# Patient Record
Sex: Female | Born: 1970 | Hispanic: Yes | Smoking: Never smoker
Health system: Southern US, Community
[De-identification: ages and names within clinical notes are randomized; demographics above are authoritative.]

## PROBLEM LIST (undated history)

## (undated) DIAGNOSIS — F329 Major depressive disorder, single episode, unspecified: Secondary | ICD-10-CM

## (undated) DIAGNOSIS — F32A Depression, unspecified: Secondary | ICD-10-CM

## (undated) DIAGNOSIS — I10 Essential (primary) hypertension: Secondary | ICD-10-CM

## (undated) DIAGNOSIS — E785 Hyperlipidemia, unspecified: Secondary | ICD-10-CM

## (undated) HISTORY — DX: Hyperlipidemia, unspecified: E78.5

---

## 2005-04-07 ENCOUNTER — Emergency Department: Payer: Self-pay | Admitting: General Practice

## 2005-05-10 ENCOUNTER — Emergency Department: Payer: Self-pay | Admitting: Emergency Medicine

## 2005-05-30 ENCOUNTER — Emergency Department: Payer: Self-pay | Admitting: Emergency Medicine

## 2005-05-31 ENCOUNTER — Ambulatory Visit: Payer: Self-pay | Admitting: Emergency Medicine

## 2007-03-13 ENCOUNTER — Inpatient Hospital Stay: Payer: Self-pay

## 2012-07-15 ENCOUNTER — Emergency Department: Payer: Self-pay | Admitting: Emergency Medicine

## 2012-07-15 LAB — DRUG SCREEN, URINE
Barbiturates, Ur Screen: NEGATIVE (ref ?–200)
Cannabinoid 50 Ng, Ur ~~LOC~~: NEGATIVE (ref ?–50)
MDMA (Ecstasy)Ur Screen: POSITIVE (ref ?–500)
Methadone, Ur Screen: NEGATIVE (ref ?–300)
Tricyclic, Ur Screen: NEGATIVE (ref ?–1000)

## 2012-07-15 LAB — COMPREHENSIVE METABOLIC PANEL
Alkaline Phosphatase: 60 U/L (ref 50–136)
Anion Gap: 8 (ref 7–16)
Bilirubin,Total: 0.3 mg/dL (ref 0.2–1.0)
Calcium, Total: 8.7 mg/dL (ref 8.5–10.1)
Chloride: 106 mmol/L (ref 98–107)
Creatinine: 0.56 mg/dL — ABNORMAL LOW (ref 0.60–1.30)
EGFR (African American): >60
EGFR (Non-African Amer.): >60
Osmolality: 276 (ref 275–301)
Potassium: 3.6 mmol/L (ref 3.5–5.1)
SGOT(AST): 20 U/L (ref 15–37)
Sodium: 138 mmol/L (ref 136–145)
Total Protein: 7.7 g/dL (ref 6.4–8.2)

## 2012-07-15 LAB — URINALYSIS, COMPLETE
Bilirubin,UR: NEGATIVE
Blood: NEGATIVE
Ketone: NEGATIVE
Nitrite: NEGATIVE
Ph: 5 (ref 4.5–8.0)
RBC,UR: 2 /HPF (ref 0–5)
Specific Gravity: 1.028 (ref 1.003–1.030)
Squamous Epithelial: 8
WBC UR: 22 /HPF (ref 0–5)

## 2012-07-15 LAB — TSH: Thyroid Stimulating Horm: 1.42 u[IU]/mL

## 2012-07-15 LAB — CBC
MCH: 29.3 pg (ref 26.0–34.0)
MCV: 86 fL (ref 80–100)
Platelet: 284 10*3/uL (ref 150–440)
WBC: 8.2 10*3/uL (ref 3.6–11.0)

## 2012-07-15 LAB — ETHANOL: Ethanol: <3 mg/dL

## 2014-12-13 ENCOUNTER — Emergency Department: Payer: Self-pay

## 2014-12-13 ENCOUNTER — Emergency Department
Admission: EM | Admit: 2014-12-13 | Discharge: 2014-12-13 | Disposition: A | Discharge status: Home / Self Care | Payer: Self-pay | Attending: Emergency Medicine | Admitting: Emergency Medicine

## 2014-12-13 ENCOUNTER — Encounter: Payer: Self-pay | Admitting: Emergency Medicine

## 2014-12-13 DIAGNOSIS — Z3202 Encounter for pregnancy test, result negative: Secondary | ICD-10-CM | POA: Insufficient documentation

## 2014-12-13 DIAGNOSIS — R1084 Generalized abdominal pain: Secondary | ICD-10-CM | POA: Insufficient documentation

## 2014-12-13 DIAGNOSIS — B349 Viral infection, unspecified: Secondary | ICD-10-CM | POA: Insufficient documentation

## 2014-12-13 LAB — COMPREHENSIVE METABOLIC PANEL
ALK PHOS: 52 U/L (ref 38–126)
ALT: 15 U/L (ref 14–54)
AST: 18 U/L (ref 15–41)
Albumin: 4.4 g/dL (ref 3.5–5.0)
Anion gap: 7 (ref 5–15)
BUN: 16 mg/dL (ref 6–20)
CALCIUM: 9.5 mg/dL (ref 8.9–10.3)
CO2: 27 mmol/L (ref 22–32)
CREATININE: 0.72 mg/dL (ref 0.44–1.00)
Chloride: 103 mmol/L (ref 101–111)
Glucose, Bld: 105 mg/dL — ABNORMAL HIGH (ref 65–99)
Potassium: 3.5 mmol/L (ref 3.5–5.1)
Sodium: 137 mmol/L (ref 135–145)
Total Bilirubin: 0.5 mg/dL (ref 0.3–1.2)
Total Protein: 7.8 g/dL (ref 6.5–8.1)

## 2014-12-13 LAB — CBC
HCT: 36.5 % (ref 35.0–47.0)
Hemoglobin: 11.9 g/dL — ABNORMAL LOW (ref 12.0–16.0)
MCH: 27 pg (ref 26.0–34.0)
MCHC: 32.5 g/dL (ref 32.0–36.0)
MCV: 82.9 fL (ref 80.0–100.0)
PLATELETS: 274 10*3/uL (ref 150–440)
RBC: 4.4 MIL/uL (ref 3.80–5.20)
RDW: 14.4 % (ref 11.5–14.5)
WBC: 9.5 10*3/uL (ref 3.6–11.0)

## 2014-12-13 LAB — URINALYSIS COMPLETE WITH MICROSCOPIC (ARMC ONLY)
BILIRUBIN URINE: NEGATIVE
Bacteria, UA: NONE SEEN
Glucose, UA: NEGATIVE mg/dL
Hgb urine dipstick: NEGATIVE
KETONES UR: NEGATIVE mg/dL
Nitrite: NEGATIVE
PROTEIN: NEGATIVE mg/dL
SPECIFIC GRAVITY, URINE: 1.02 (ref 1.005–1.030)
pH: 6 (ref 5.0–8.0)

## 2014-12-13 LAB — LIPASE, BLOOD: Lipase: 27 U/L (ref 22–51)

## 2014-12-13 MED ORDER — IBUPROFEN 600 MG PO TABS
600.0000 mg | ORAL_TABLET | Freq: Once | ORAL | Status: AC
  Administered 2014-12-13: 600 mg via ORAL
  Filled 2014-12-13: qty 1

## 2014-12-13 MED ORDER — SODIUM CHLORIDE 0.9 % IV BOLUS (SEPSIS): 1000.0000 mL | Freq: Once | INTRAVENOUS | Status: DC

## 2014-12-13 MED ORDER — KETOROLAC TROMETHAMINE 30 MG/ML IJ SOLN
30.0000 mg | Freq: Once | INTRAMUSCULAR | Status: DC
  Filled 2014-12-13: qty 1

## 2014-12-13 NOTE — ED Notes (Signed)
Patient to ED with report of generalized aches and pain specifically her head, her abdomen, her hips, her neck. Patient is concerned that she may have a high blood sugar although has never been diagnosed with Diabetes. Patient also reports she took a home pregnancy test which was positive but that doctor checked yesterday and it was negative.

## 2014-12-13 NOTE — ED Notes (Signed)
Pt states she is unable to provide urine sample at this time.

## 2014-12-13 NOTE — ED Notes (Signed)
Jacqui, interpreter, at bedside.

## 2014-12-13 NOTE — ED Notes (Signed)
Maritza, interpreter, and Dr. Huel Cote at bedside.

## 2014-12-13 NOTE — ED Provider Notes (Signed)
Time Seen: Approximately 1340  I have reviewed the triage notes  Chief Complaint: Headache; Generalized Body Aches; and Breast Pain   History of Present Illness: Lori Pham is a 44 y.o. female states she's had some generalized body aches and discomforts now for the last 2 weeks. Patient presents with low-grade fever. She denies any tick bite or exposure tickborne disease. She states she has a mild headache over the back of her head. She went to a clinic yesterday and was told that she might have diabetes?. Patient states that she also had some inconsistencies on some pregnancy test. She had a positive pregnancy test at home and a negative one at the office yesterday. She's had some nausea but no persistent vomiting. She's had some loose stool without any true signs of diarrhea. She denies any photophobia.   History reviewed. No pertinent past medical history.  There are no active problems to display for this patient.   History reviewed. No pertinent past surgical history.  History reviewed. No pertinent past surgical history.  No current outpatient prescriptions on file.  Allergies:  Review of patient's allergies indicates not on file.  Family History: History reviewed. No pertinent family history.  Social History: Social History  Substance Use Topics  . Smoking status: Never Smoker   . Smokeless tobacco: None  . Alcohol Use: No     Review of Systems:   10 point review of systems was performed and was otherwise negative:  Constitutional: The patient was aware of her low-grade fever Eyes: No visual disturbances ENT: No sore throat, ear pain Cardiac: No chest pain Respiratory: No shortness of breath, wheezing, or stridor Abdomen: Mild diffuse lower abdominal cramping, no vomiting, No diarrhea Endocrine: No weight loss, No night sweats Extremities: Patient describes diffuse joint discomfort Skin: No rashes, easy bruising Neurologic: No focal  weakness, trouble with speech or swollowing Urologic: No dysuria, Hematuria, or urinary frequency   Physical Exam:  ED Triage Vitals  Enc Vitals Group     BP 12/13/14 1250 122/77 mmHg     Pulse Rate 12/13/14 1250 72     Resp 12/13/14 1250 20     Temp 12/13/14 1250 99 F (37.2 C)     Temp Source 12/13/14 1250 Oral     SpO2 12/13/14 1250 98 %     Weight 12/13/14 1250 186 lb (84.369 kg)     Height 12/13/14 1250 5' 4.96" (1.65 m)     Head Cir --      Peak Flow --      Pain Score 12/13/14 1250 5     Pain Loc --      Pain Edu? --      Excl. in GC? --     General: Awake , Alert , and Oriented times 3; GCS 15 Head: Normal cephalic , atraumatic Eyes: Pupils equal , round, reactive to light Nose/Throat: No nasal drainage, patent upper airway without erythema or exudate.  Neck: Supple, Full range of motion, No anterior adenopathy or palpable thyroid masses Lungs: Clear to ascultation without wheezes , rhonchi, or rales Heart: Regular rate, regular rhythm without murmurs , gallops , or rubs Abdomen: Soft, non tender without rebound, guarding , or rigidity; bowel sounds positive and symmetric in all 4 quadrants. No organomegaly .        Extremities: 2 plus symmetric pulses. No edema, clubbing or cyanosis Neurologic: normal ambulation, Motor symmetric without deficits, sensory intact Skin: warm, dry, no rashes   Labs:  All laboratory work was reviewed including any pertinent negatives or positives listed below:  Labs Reviewed  COMPREHENSIVE METABOLIC PANEL - Abnormal; Notable for the following:    Glucose, Bld 105 (*)    All other components within normal limits  CBC - Abnormal; Notable for the following:    Hemoglobin 11.9 (*)    All other components within normal limits  LIPASE, BLOOD  URINALYSIS COMPLETEWITH MICROSCOPIC ([facility] ONLY)  POC URINE PREG, ED   laboratory work was reviewed and showed no significant abnormalities    Radiology:    CHEST 2 VIEW  COMPARISON:  None.  FINDINGS: The heart size and mediastinal contours are within normal limits. Both lungs are clear. The visualized skeletal structures are unremarkable.  IMPRESSION: No active cardiopulmonary disease.    I personally reviewed the radiologic studies   Procedures: None Critical Care: None    ED Course: Patient explanation of her results and her testing done through interpreter. Her diagnosis is tentatively a viral syndrome given a broad-spectrum of her symptoms along with the low-grade fever. She does not appear to have a bacterial source for infection such as pneumonia or urinary tract infection. I do not suspect this is some form of meningitis. Patient was advised take over-the-counter ibuprofen and drink plenty of fluids and return here for condition worsens.    Assessment:  Viral syndrome   Final Clinical Impression: Viral syndrome  Final diagnoses:  None     Plan:Patient was advised to return immediately if condition worsens. Patient was advised to follow up with her primary care physician or other specialized physicians involved and in their current assessment.          Go to top  Jennye Moccasin, MD 12/13/14 [id number]

## 2014-12-15 LAB — POCT PREGNANCY, URINE: PREG TEST UR: NEGATIVE

## 2015-10-04 ENCOUNTER — Ambulatory Visit
Admission: EM | Admit: 2015-10-04 | Discharge: 2015-10-04 | Disposition: A | Discharge status: Home / Self Care | Payer: Worker's Compensation | Attending: Family Medicine | Admitting: Family Medicine

## 2015-10-04 ENCOUNTER — Encounter: Payer: Self-pay | Admitting: Emergency Medicine

## 2015-10-04 DIAGNOSIS — S060X0A Concussion without loss of consciousness, initial encounter: Secondary | ICD-10-CM | POA: Diagnosis not present

## 2015-10-04 DIAGNOSIS — S161XXA Strain of muscle, fascia and tendon at neck level, initial encounter: Secondary | ICD-10-CM

## 2015-10-04 HISTORY — DX: Essential (primary) hypertension: I10

## 2015-10-04 MED ORDER — CYCLOBENZAPRINE HCL 10 MG PO TABS: 10.0000 mg | ORAL_TABLET | Freq: Every day | ORAL | Status: AC

## 2015-10-04 NOTE — ED Notes (Addendum)
On Monday a box weighing 5 lbs. Fell from above and hit her on the back of her head while she was at work.  Patient reports HAS and nausea.

## 2015-10-04 NOTE — Discharge Instructions (Signed)
Conmocin cerebral - Adultos (Concussion, Adult) Una conmocin cerebral, o traumatismo cerebral cerrado, es una lesin cerebral causada por un golpe directo en la cabeza o por un movimiento rpido y brusco (sacudida) de la cabeza o el cuello. Generalmente no pone en peligro la vida. An as, los efectos de [place] conmocin cerebral pueden ser graves. Si sufri una conmocin cerebral antes, es ms probable que experimente sntomas similares a una conmocin cerebral despus de un golpe directo en la cabeza.  CAUSAS   Un golpe directo en la cabeza, como al chocar contra otro jugador en un partido de ftbol, recibir un golpe en una lucha o golpearse la cabeza contra una superficie dura.  Una sacudida de la cabeza o el cuello que hace que el cerebro se mueva hacia adelante y [place] atrs dentro del crneo, como en un choque automovilstico. SIGNOS Y SNTOMAS  Los signos de una conmocin cerebral pueden ser difciles de Chief Strategy Officer. En un primer momento, los pacientes, familiares y profesionales tal vez no los adviertan. Puede ser que aparentemente est normal pero que acte o se sienta diferente. Por lo general, los sntomas son transitorios pero pueden durar [employer], semanas o an ms. Algunos sntomas pueden aparecer inmediatamente mientras otros pueden manifestarse despus de algunas horas o [address]  [address]. Cada lesin en la cabeza es diferente. Los sntomas son:   Dolor de cabeza leve a moderado, que no se [place].  Sensacin de presin dentro de la cabeza.  Presentar ms dificultad que lo habitual para:  Aprender o recordar cosas que ha escuchado.  Responder preguntas.  Prestar atencin o concentrarse.  Organizar las tareas diarias.  Tomar decisiones y [employer].  Lentitud para pensar, actuar o reaccionar, hablar o leer.  Sentirse perdido o confundirse con facilidad.  Sentirse cansado todo el tiempo o con falta de Engineer, drilling (fatigado).  Sentirse somnoliento.  Trastornos del  sueo.  Dormir ms que lo habitual.  Dormir menos que lo habitual.  Problemas para conciliar el sueo.  Problemas para dormir (insomnio).  Prdida del equilibrio, sensacin de mareo.  Nuseas o vmitos.  Adormecimiento u hormigueo.  Mayor sensibilidad para:  Los sonidos.  Las luces.  Distracciones.  Problemas visuales o cansancio en los ojos.  Disminucin del sentido del gusto o Cabin crew.  Zumbidos en los odos.  Cambios de humor, como sentirse triste o ansioso.  Irritacin o enojo por cosas pequeas o sin motivos.  Falta de motivacin.  Ver o escuchar cosas que otras personas no ven ni escuchan (alucinaciones). DIAGNSTICO  El mdico diagnosticar una conmocin cerebral basndose en la descripcin de la lesin y los sntomas. Le preguntar si se desmay (perdi el conocimiento) y si tiene dificultad para recordar los sucesos ocurridos inmediatamente antes y durante el traumatismo.  La evaluacin tambin puede incluir:   Un escaneo cerebral para encontrar signos de lesin cerebral. Aunque los estudios no muestren lesiones, igual puede haber sufrido una conmocin cerebral.  Anlisis de sangre para asegurarse de que no hay otros problemas. TRATAMIENTO   La mayor parte de las conmociones se tratan en el servicio de emergencias, el rea de atencin de urgencia o el consultorio mdico. Es posible que deba Engineer, maintenance hospital durante la noche para Advertising account planner.  Informe a su mdico si toma medicamentos, incluidos los medicamentos recetados, medicamentos de venta libre y remedios naturales. Algunos medicamentos, como los anticoagulantes y la aspirina, pueden aumentar la probabilidad de sufrir complicaciones. Tambin informe a su mdico si ha bebido alcohol o consume drogas. Esta  informacin puede Licensed conveyancer.  El mdico le dar el alta con instrucciones importantes que deber seguir.  La velocidad de recuperacin de una conmocin cerebral  depende de varios factores. Entre ellos se incluyen la gravedad de la conmocin cerebral, la zona del cerebro lesionada, la edad y [place] de salud previo a la conmocin cerebral.  La mayora de las personas que han sufrido una lesin leve se recuperan completamente. La recuperacin puede llevar tiempo. En general, la recuperacin es ms lenta en las [employer]. Adems, a aquellas personas que ya han sufrido una conmocin cerebral en el pasado les llevar ms tiempo recuperarse de la lesin actual. INSTRUCCIONES PARA EL CUIDADO EN EL HOGAR  Instrucciones generales  Siga cuidadosamente las indicaciones que su mdico le ha dado.  Utilice los medicamentos de venta libre o recetados para Primary school teacher, Environmental health practitioner o la fiebre, segn se lo indique el mdico.  Tome slo los medicamentos que su mdico le haya autorizado.  No beba alcohol hasta que su mdico lo autorice. El alcohol y algunas otras sustancias pueden demorar su recuperacin y ponerlo en riesgo de nuevas lesiones.  Si le resulta ms difcil que lo habitual recordar las cosas, escrbalas.  Trate de hacer una cosa por vez si se distrae con facilidad. Por ejemplo, no mire televisin mientras prepara la cena.  Consulte con familiares y amigos si debe tomar decisiones importantes.  Cumpla con todas las visitas de control. Se recomienda realizar varias evaluaciones de los sntomas para favorecer su recuperacin.  Controle sus sntomas y pdale a otras personas que tambin lo hagan. En algunos casos pueden aparecer complicaciones luego de una conmocin cerebral. Los adultos mayores con lesin cerebral pueden tener un mayor riesgo de complicaciones graves, como un cogulo sanguneo en el cerebro.  Informe a los [employer], el departamento de enfermera de la escuela, el consejero escolar, Public affairs consultant acerca de su lesin, los sntomas y las restricciones que tiene. Hgales saber lo que puede y lo que no Scientist, product/process development. Ellos  debern observar:  Aumento en los problemas de atencin o Librarian, academic.  Ms dificultades para recordar o aprender informacin nueva.  Aumento del tiempo que necesita para completar tareas o encargos.  Aumento de la irritabilidad o disminucin de la capacidad para Social worker.  Aparicin de nuevos sntomas.  Haga reposo. El descanso favorece la curacin del cerebro. Asegrese de que:  Duerme bien por la noche. Evite quedarse despierto muy tarde por la noche.  Se va a dormir a la [employer] de [address] y los fines de [place].  Descanse Administrator. Toma siestas durante el da o momentos de descanso cuando se siente cansado.  Limita las actividades que requieren mucha atencin o Librarian, academic. Estas pueden ser:  Tareas para el hogar o trabajos relacionados con el empleo.  Mirar televisin.  Trabajar en la computadora.  Evite toda situacin en la que se pudiera producir otra lesin cerebral (ftbol americano, hockey, ftbol, bsquet, artes marciales, deportes de nieve cuesta abajo y andar a caballo). El problema empeorar cada vez que experimente una conmocin cerebral. Debe evitar estas actividades hasta que sea evaluado por el mdico correspondiente. Regreso a las Mining engineer a sus actividades habituales gradualmente, no de [place]. Debe darles al cuerpo y el cerebro su tiempo para recuperarse.  No retome la prctica de deportes ni otras actividades deportivas hasta que su mdico le diga que puede [place].  Consulte a su mdico sobre  cundo puede conducir automviles, andar en bicicleta u operar mquinas pesadas. La capacidad para reaccionar puede ser ms lenta luego de una lesin cerebral. Nunca realice estas actividades si se siente mareado.  Pregunte a su mdico cuando podr volver a la escuela o al [place]en Campi. Prevencin de otra conmocin cerebral Es muy importante que evite otra lesin cerebral, especialmente antes de que se haya  recuperado. En casos raros, una nueva lesin puede causar daos cerebrales permanentes, hinchazn del cerebro o la muerte. El riesgo es mayor durante los primeros 7 a 10 das despus de una lesin en la cabeza. Evite las lesiones:   Use el cinturn de seguridad al conducir un automvil.  Beba alcohol solo con moderacin.  Use un casco cuando ande en bicicleta, esque, patine o realice actividades similares.  Evite actividades que podran causar una segunda conmocin cerebral, como deportes de contacto o recreativos hasta que su mdico lo autorice.  Implemente medidas de seguridad en el hogar.  Evite el desorden y objetos que puedan ser peligrosos en pisos y escaleras.  Coloque barras en los baos y pasamanos en las escaleras.  Ponga alfombras antideslizantes en pisos y baeras.  Mejore la iluminacin en zonas de penumbra. SOLICITE ATENCIN MDICA SI:   Aumentan sus problemas de atencin o concentracin.  Aumentan sus dificultades para recordar o aprender informacin nueva.  Necesita ms tiempo que antes para completar las tareas o asignaciones.  Aumenta la irritabilidad o disminuye la capacidad para Social worker.  Tiene ms sntomas que antes. Solicite atencin mdica si presenta alguno de los siguientes sntomas durante ms de 2 semanas despus de su lesin:   Dolor de cabeza que perdura (crnico).  Mareos o problemas con el equilibrio.  Nuseas.  Problemas de visin.  Aumento de la sensibilidad a los ruidos o Statistician.  Depresin y cambios en el estado de nimo.  Ansiedad o irritabilidad.  Problemas de memoria.  Dificultad para concentrarse o Engineer, technical sales.  Problemas para dormir.  Cansancio permanente. SOLICITE ATENCIN MDICA DE INMEDIATO SI:   Los dolores de [place] son intensos o empeoran. Esto puede ser un signo de que hay un cogulo sanguneo en la cabeza.  Siente debilidad (aun si es solo en [place], una pierna o parte del  rostro).  Siente entumecimiento.  Le falta coordinacin.  Vomita repetidas veces.  Est ms somnoliento.  Una pupila est ms grande que la otra.  Tiene convulsiones.  Tiene dificultad para hablar.  Siente una confusin que va en aumento. Esto puede ser un signo de que hay un cogulo sanguneo en el cerebro.  Aumenta la intranquilidad, la agitacin o la irritabilidad.  No puede Nutritional therapist o lugares.  Siente dolor en el cuello.  Le resulta difcil despertarse.  Tiene cambios de conducta inusuales.  Pierde la conciencia. ASEGRESE DE QUE:   Comprende estas instrucciones.  Controlar su afeccin.  Recibir ayuda de inmediato si no mejora o si empeora.   Esta informacin no tiene Theme park manager el consejo del mdico. Asegrese de hacerle al mdico cualquier pregunta que tenga.   Document Released: 03/14/2008 Document Revised: 04/22/2014 Elsevier Interactive Patient Education [employer] Post-Concussion Syndrome Post-concussion syndrome describes the symptoms that can occur after a head injury. These symptoms can last from weeks to months. CAUSES  It is not clear why some head injuries cause post-concussion syndrome. It can occur whether your head injury was mild or severe and whether you were wearing head protection or not.  SIGNS AND SYMPTOMS  Memory difficulties.  Dizziness.  Headaches.  Double vision or blurry vision.  Sensitivity to light.  Hearing difficulties.  Depression.  Tiredness.  Weakness.  Difficulty with concentration.  Difficulty sleeping or staying asleep.  Vomiting.  Poor balance or instability on your feet.  Slow reaction time.  Difficulty learning and remembering things you have heard. DIAGNOSIS  There is no test to determine whether you have post-concussion syndrome. Your health care provider may order an imaging scan of your brain, such as a CT scan, to check for other problems that may be causing your  symptoms (such as a severe injury inside your skull). TREATMENT  Usually, these problems disappear over time without medical care. Your health care provider may prescribe medicine to help ease your symptoms. It is important to follow up with a neurologist to evaluate your recovery and address any lingering symptoms or issues. HOME CARE INSTRUCTIONS   Take medicines only as directed by your health care provider. Do not take aspirin. Aspirin can slow blood clotting.  Sleep with your head slightly elevated to help with headaches.  Avoid any situation where there is potential for another head injury. This includes football, hockey, soccer, basketball, martial arts, downhill snow sports, and horseback riding. Your condition will get worse every time you experience a concussion. You should avoid these activities until you are evaluated by the appropriate follow-up health care providers.  Keep all follow-up visits as directed by your health care provider. This is important. SEEK MEDICAL CARE IF:  You have increased problems paying attention or concentrating.  You have increased difficulty remembering or learning new information.  You need more time to complete tasks or assignments than before.  You have increased irritability or decreased ability to cope with stress.  You have more symptoms than before. Seek medical care if you have any of the following symptoms for more than two weeks after your injury:  Lasting (chronic) headaches.  Dizziness or balance problems.  Nausea.  Vision problems.  Increased sensitivity to noise or light.  Depression or mood swings.  Anxiety or irritability.  Memory problems.  Difficulty concentrating or paying attention.  Sleep problems.  Feeling tired all the time. SEEK IMMEDIATE MEDICAL CARE IF:  You have confusion or unusual drowsiness.  Others find it difficult to wake you up.  You have nausea or persistent, forceful vomiting.  You feel  like you are moving when you are not (vertigo). Your eyes may move rapidly back and forth.  You have convulsions or faint.  You have severe, persistent headaches that are not relieved by medicine.  You cannot use your arms or legs normally.  One of your pupils is larger than the other.  You have clear or bloody discharge from your nose or ears.  Your problems are getting worse, not better. MAKE SURE YOU:  Understand these instructions.  Will watch your condition.  Will get help right away if you are not doing well or get worse.   This information is not intended to replace advice given to you by your health care provider. Make sure you discuss any questions you have with your health care provider.   Document Released: 09/21/2001 Document Revised: 04/22/2014 Document Reviewed: 07/07/2013 Elsevier Interactive Patient Education [employer]2016 Elsevier Inc.

## 2015-10-04 NOTE — ED Provider Notes (Signed)
CSN: [id number]     Arrival date & time 10/04/15  1025 History   First MD Initiated Contact with Patient 10/04/15 1044     Chief Complaint  Patient presents with  . Head Injury  . Worker's Comp    (Consider location/radiation/quality/duration/timing/severity/associated sxs/prior Treatment) HPI Comments: 45 yo female with a c/o intermittent headaches and upper back pain since injury 2 days ago at work. Patient states she was bent over when a box of paper towels (total weight about 5 pounds, per patient) fell on the back of her head. Patient denies any loss of consciousness, vomiting, vision changes, numbness/tingling. However states felt a little "whoozy" or lightheaded right after the injury. Since then has had headaches, but denies "worst headache" ever.   The history is provided by the patient.    Past Medical History  Diagnosis Date  . Hypertension    History reviewed. No pertinent past surgical history. History reviewed. No pertinent family history. Social History  Substance Use Topics  . Smoking status: Never Smoker   . Smokeless tobacco: Never Used  . Alcohol Use: No   OB History    No data available     Review of Systems  Allergies  Penicillins  Home Medications   Prior to Admission medications   Medication Sig Start Date End Date Taking? Authorizing Provider  cyclobenzaprine (FLEXERIL) 10 MG tablet Take 1 tablet (10 mg total) by mouth at bedtime. 10/04/15   Payton Mccallum, MD   Meds Ordered and Administered this Visit  Medications - No data to display  BP 107/74 mmHg  Pulse 56  Temp(Src) 97.6 F (36.4 C) (Oral)  Resp 16  Ht  (1.651 m)  Wt 180 lb (81.647 kg)  BMI 29.95 kg/m2  SpO2 100%  LMP 09/10/2015 (Approximate) No data found.   Physical Exam  Constitutional: She is oriented to person, place, and time. She appears well-developed and well-nourished. No distress.  HENT:  Head: Normocephalic and atraumatic.  Right Ear: Tympanic membrane, external  ear and ear canal normal.  Left Ear: Tympanic membrane, external ear and ear canal normal.  Nose: Nose normal.  Mouth/Throat: Uvula is midline, oropharynx is clear and moist and mucous membranes are normal.  Eyes: EOM are normal. Pupils are equal, round, and reactive to light.  Neck: Normal range of motion. Neck supple. No JVD present. No tracheal deviation present. No thyromegaly present.  Cardiovascular: Normal rate, regular rhythm, normal heart sounds and intact distal pulses.   No murmur heard. Pulmonary/Chest: Effort normal and breath sounds normal. No stridor. No respiratory distress. She has no wheezes. She has no rales. She exhibits no tenderness.  Musculoskeletal: She exhibits tenderness. She exhibits no edema.       Cervical back: She exhibits tenderness (over the cervical paraspinous muscles and trapezius muscles; no bony tenderness) and spasm. She exhibits normal range of motion, no bony tenderness, no swelling, no edema, no deformity, no laceration and normal pulse.       Back:  Lymphadenopathy:    She has no cervical adenopathy.  Neurological: She is alert and oriented to person, place, and time. She has normal reflexes. She displays no tremor and normal reflexes. No cranial nerve deficit or sensory deficit. She exhibits normal muscle tone. Coordination and gait normal.  Skin: Skin is warm. No rash noted. She is not diaphoretic. No erythema.  Psychiatric: She has a normal mood and affect. Her behavior is normal. Judgment and thought content normal.  Vitals reviewed.   ED  Course  Procedures (including critical care time)  Labs Review Labs Reviewed - No data to display  Imaging Review No results found.   Visual Acuity Review  Right Eye Distance:   Left Eye Distance:   Bilateral Distance:    Right Eye Near:   Left Eye Near:    Bilateral Near:         MDM   1. Concussion, without loss of consciousness, initial encounter   2. Neck strain, initial encounter     (neurologic exam normal/non-focal)   1.  diagnosis reviewed with patient 2. rx as per orders above; reviewed possible side effects, interactions, risks and benefits; rx given for flexeril 3. Recommend supportive treatment with otc analgesics 4. Modified duty due to diagnosis above 5. [facility]    Payton Mccallumrlando Marchel Foote, MD 10/11/15 1233

## 2016-06-22 ENCOUNTER — Emergency Department: Payer: No Typology Code available for payment source

## 2016-06-22 ENCOUNTER — Emergency Department
Admission: EM | Admit: 2016-06-22 | Discharge: 2016-06-22 | Disposition: A | Discharge status: Home / Self Care | Payer: No Typology Code available for payment source | Attending: Emergency Medicine | Admitting: Emergency Medicine

## 2016-06-22 DIAGNOSIS — M545 Low back pain: Secondary | ICD-10-CM | POA: Insufficient documentation

## 2016-06-22 DIAGNOSIS — Y999 Unspecified external cause status: Secondary | ICD-10-CM | POA: Diagnosis not present

## 2016-06-22 DIAGNOSIS — S0990XA Unspecified injury of head, initial encounter: Secondary | ICD-10-CM | POA: Diagnosis present

## 2016-06-22 DIAGNOSIS — Y9389 Activity, other specified: Secondary | ICD-10-CM | POA: Diagnosis not present

## 2016-06-22 DIAGNOSIS — M542 Cervicalgia: Secondary | ICD-10-CM | POA: Insufficient documentation

## 2016-06-22 DIAGNOSIS — R51 Headache: Secondary | ICD-10-CM | POA: Diagnosis not present

## 2016-06-22 DIAGNOSIS — Y92410 Unspecified street and highway as the place of occurrence of the external cause: Secondary | ICD-10-CM | POA: Insufficient documentation

## 2016-06-22 MED ORDER — IBUPROFEN 800 MG PO TABS
800.0000 mg | ORAL_TABLET | Freq: Once | ORAL | Status: DC
  Filled 2016-06-22: qty 1

## 2016-06-22 MED ORDER — OXYCODONE-ACETAMINOPHEN 5-325 MG PO TABS
1.0000 | ORAL_TABLET | Freq: Once | ORAL | Status: DC
  Filled 2016-06-22: qty 1

## 2016-06-22 MED ORDER — TRAMADOL HCL 50 MG PO TABS: 50.0000 mg | ORAL_TABLET | Freq: Four times a day (QID) | ORAL | 0 refills | Status: AC | PRN

## 2016-06-22 MED ORDER — CYCLOBENZAPRINE HCL 5 MG PO TABS: 5.0000 mg | ORAL_TABLET | Freq: Three times a day (TID) | ORAL | 0 refills | Status: AC | PRN

## 2016-06-22 NOTE — ED Provider Notes (Signed)
ED ECG REPORT I, Jennye MoccasinBrian S Jermie Hippe, the attending physician, personally viewed and interpreted this ECG.  Date: 06/22/2016 EKG Time: *1448 Rate: 54 Rhythm: Sinus bradycardia QRS Axis: normal Intervals: normal ST/T Wave abnormalities: normal Conduction Disturbances: none Narrative Interpretation: unremarkable Normal EKG  Lacretia NicksBrian Dixie Jafri M.D.   Jennye MoccasinBrian S Shawnia Vizcarrondo, MD 06/22/16 [phone]

## 2016-06-22 NOTE — ED Notes (Signed)
Pt given sanitary pad upon request
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
## 2016-06-22 NOTE — ED Notes (Signed)
Pt given pair of blue paper scrub pants.

## 2016-11-11 ENCOUNTER — Encounter: Payer: Self-pay | Admitting: Emergency Medicine

## 2016-11-11 ENCOUNTER — Emergency Department
Admission: EM | Admit: 2016-11-11 | Discharge: 2016-11-11 | Disposition: A | Discharge status: Home / Self Care | Payer: 59 | Attending: Student in an Organized Health Care Education/Training Program | Admitting: Student in an Organized Health Care Education/Training Program

## 2016-11-11 ENCOUNTER — Emergency Department: Payer: 59

## 2016-11-11 DIAGNOSIS — M542 Cervicalgia: Secondary | ICD-10-CM | POA: Diagnosis present

## 2016-11-11 DIAGNOSIS — Y998 Other external cause status: Secondary | ICD-10-CM | POA: Insufficient documentation

## 2016-11-11 DIAGNOSIS — S39012A Strain of muscle, fascia and tendon of lower back, initial encounter: Secondary | ICD-10-CM | POA: Insufficient documentation

## 2016-11-11 DIAGNOSIS — Y9389 Activity, other specified: Secondary | ICD-10-CM | POA: Diagnosis not present

## 2016-11-11 DIAGNOSIS — S39012D Strain of muscle, fascia and tendon of lower back, subsequent encounter: Secondary | ICD-10-CM

## 2016-11-11 DIAGNOSIS — M549 Dorsalgia, unspecified: Secondary | ICD-10-CM | POA: Diagnosis not present

## 2016-11-11 DIAGNOSIS — Y929 Unspecified place or not applicable: Secondary | ICD-10-CM | POA: Insufficient documentation

## 2016-11-11 DIAGNOSIS — S161XXA Strain of muscle, fascia and tendon at neck level, initial encounter: Secondary | ICD-10-CM | POA: Insufficient documentation

## 2016-11-11 DIAGNOSIS — S161XXD Strain of muscle, fascia and tendon at neck level, subsequent encounter: Secondary | ICD-10-CM

## 2016-11-11 DIAGNOSIS — I10 Essential (primary) hypertension: Secondary | ICD-10-CM | POA: Diagnosis not present

## 2016-11-11 HISTORY — DX: Major depressive disorder, single episode, unspecified: F32.9

## 2016-11-11 HISTORY — DX: Depression, unspecified: F32.A

## 2016-11-11 LAB — POCT PREGNANCY, URINE: PREG TEST UR: NEGATIVE

## 2016-11-11 MED ORDER — TRAMADOL HCL 50 MG PO TABS: 50.0000 mg | ORAL_TABLET | Freq: Four times a day (QID) | ORAL | 0 refills | Status: AC | PRN

## 2016-11-11 MED ORDER — METHOCARBAMOL 750 MG PO TABS: 750.0000 mg | ORAL_TABLET | Freq: Four times a day (QID) | ORAL | 0 refills | Status: AC

## 2016-11-11 MED ORDER — IBUPROFEN 600 MG PO TABS: 600.0000 mg | ORAL_TABLET | Freq: Three times a day (TID) | ORAL | 0 refills | Status: AC | PRN
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
## 2016-11-12 ENCOUNTER — Encounter: Payer: Self-pay | Admitting: Emergency Medicine

## 2019-07-02 ENCOUNTER — Ambulatory Visit: Payer: 59 | Attending: Internal Medicine

## 2019-07-02 DIAGNOSIS — Z23 Encounter for immunization: Secondary | ICD-10-CM

## 2019-07-02 NOTE — Progress Notes (Signed)
   Covid-19 Vaccination Clinic  Name:  Lori Pham    MRN: [id number] DOB: 1970-10-25  07/02/2019  Ms. Lori Pham was observed post Covid-19 immunization for 15 minutes without incident. She was provided with Vaccine Information Sheet and instruction to access the V-Safe system.   Ms. Lori Pham was instructed to call 911 with any severe reactions post vaccine: Marland Kitchen Difficulty breathing  . Swelling of face and throat  . A fast heartbeat  . A bad rash all over body  . Dizziness and weakness   Immunizations Administered    Name Date Dose VIS Date Route   Pfizer COVID-19 Vaccine 07/02/2019  4:42 PM 0.3 mL 03/26/2019 Intramuscular   Manufacturer: [employer], [employer]   Lot: [id number]   NDC: [address]-5

## 2019-07-24 ENCOUNTER — Ambulatory Visit: Payer: 59 | Attending: Internal Medicine

## 2019-07-24 DIAGNOSIS — Z23 Encounter for immunization: Secondary | ICD-10-CM

## 2019-07-24 NOTE — Progress Notes (Signed)
   Covid-19 Vaccination Clinic  Name:  Lori Pham    MRN: [id number] DOB: 30-Aug-1970  07/24/2019  Ms. Lori Pham was observed post Covid-19 immunization for 15 minutes without incident. She was provided with Vaccine Information Sheet and instruction to access the V-Safe system.   Ms. Lori Pham was instructed to call 911 with any severe reactions post vaccine: Marland Kitchen Difficulty breathing  . Swelling of face and throat  . A fast heartbeat  . A bad rash all over body  . Dizziness and weakness   Immunizations Administered    Name Date Dose VIS Date Route   Pfizer COVID-19 Vaccine 07/24/2019 10:41 AM 0.3 mL 03/26/2019 Intramuscular   Manufacturer: [employer], [employer]   Lot: [id number]   NDC: [address]-4

## 2019-09-29 ENCOUNTER — Other Ambulatory Visit: Payer: Self-pay

## 2019-09-29 DIAGNOSIS — M79604 Pain in right leg: Secondary | ICD-10-CM | POA: Insufficient documentation

## 2019-09-29 DIAGNOSIS — R42 Dizziness and giddiness: Secondary | ICD-10-CM | POA: Insufficient documentation

## 2019-09-29 LAB — BASIC METABOLIC PANEL
Anion gap: 13 (ref 5–15)
BUN: 19 mg/dL (ref 6–20)
CO2: 23 mmol/L (ref 22–32)
Calcium: 9.4 mg/dL (ref 8.9–10.3)
Chloride: 100 mmol/L (ref 98–111)
Creatinine, Ser: 0.61 mg/dL (ref 0.44–1.00)
GFR calc Af Amer: >60 mL/min (ref >60)
GFR calc non Af Amer: >60 mL/min (ref >60)
Glucose, Bld: 140 mg/dL — ABNORMAL HIGH (ref 70–99)
Potassium: 3.2 mmol/L — ABNORMAL LOW (ref 3.5–5.1)
Sodium: 136 mmol/L (ref 135–145)

## 2019-09-29 LAB — GLUCOSE, CAPILLARY: Glucose-Capillary: 130 mg/dL — ABNORMAL HIGH (ref 70–99)

## 2019-09-29 LAB — CBC
HCT: 30.2 % — ABNORMAL LOW (ref 36.0–46.0)
Hemoglobin: 9.5 g/dL — ABNORMAL LOW (ref 12.0–15.0)
MCH: 22.2 pg — ABNORMAL LOW (ref 26.0–34.0)
MCHC: 31.5 g/dL (ref 30.0–36.0)
MCV: 70.6 fL — ABNORMAL LOW (ref 80.0–100.0)
Platelets: 322 10*3/uL (ref 150–400)
RBC: 4.28 MIL/uL (ref 3.87–5.11)
RDW: 16.9 % — ABNORMAL HIGH (ref 11.5–15.5)
WBC: 14.9 10*3/uL — ABNORMAL HIGH (ref 4.0–10.5)
nRBC: 0 % (ref 0.0–0.2)

## 2019-09-29 LAB — TROPONIN I (HIGH SENSITIVITY): Troponin I (High Sensitivity): 3 ng/L (ref <18)

## 2019-09-29 MED ORDER — ONDANSETRON 4 MG PO TBDP
4.0000 mg | ORAL_TABLET | Freq: Once | ORAL | Status: AC
  Administered 2019-09-29: 4 mg via ORAL
  Filled 2019-09-29: qty 1

## 2019-09-29 NOTE — ED Triage Notes (Signed)
EMS brought pt in from home for c/o leg/feet pain, dizziness, chills, N/V

## 2019-09-29 NOTE — ED Triage Notes (Signed)
Pt presents to ER from home via EMS with complaints of right leg pain, pt reports pain is constant pt also reports dizziness. Pt talks in complete sentences no distress noted

## 2019-09-30 ENCOUNTER — Emergency Department
Admission: EM | Admit: 2019-09-30 | Discharge: 2019-09-30 | Disposition: A | Discharge status: Home / Self Care | Payer: 59 | Attending: Emergency Medicine | Admitting: Emergency Medicine

## 2019-09-30 ENCOUNTER — Emergency Department: Payer: 59

## 2019-09-30 DIAGNOSIS — M79604 Pain in right leg: Secondary | ICD-10-CM

## 2019-09-30 MED ORDER — GABAPENTIN 100 MG PO CAPS
100.0000 mg | ORAL_CAPSULE | Freq: Two times a day (BID) | ORAL | 0 refills | Status: AC
Start: 2019-09-30 — End: 2019-10-15

## 2019-09-30 MED ORDER — GABAPENTIN 100 MG PO CAPS
100.0000 mg | ORAL_CAPSULE | Freq: Once | ORAL | Status: AC
  Administered 2019-09-30: 100 mg via ORAL
  Filled 2019-09-30: qty 1

## 2019-09-30 NOTE — ED Provider Notes (Signed)
[facility] Emergency Department Provider Note  Time seen: 3:00 AM  I have reviewed the triage vital signs and the nursing notes.   HISTORY  Chief Complaint Leg Pain (Right ) and Dizziness   HPI Lori Pham is a 49 y.o. female with a past medical history of depression, hypertension, presents to the emergency department for multiple symptoms.  According to the patient after her first Covid vaccine in March she developed difficulty sleeping and intermittent dizziness and fatigue.  States that symptoms went away after several weeks but received her second Covid vaccine in April.  Since that time she has been feeling once again intermittent dizzy with pain in her arms and legs, and feels like "burning" in her veins.  Denies any chest pain or shortness of breath.  Patient states the pain most of her arms and her legs have gone away besides her right leg which she continues to have discomfort in.  Patient states a burning type sensation in her right thigh.  States this has been ongoing for months.  No acute change today states it prevents her from sleeping due to the discomfort.  No fever.   Past Medical History:  Diagnosis Date  . Depression   . Hypertension     There are no problems to display for this patient.   No past surgical history on file.  Prior to Admission medications   Medication Sig Start Date End Date Taking? Authorizing Provider  cyclobenzaprine (FLEXERIL) 10 MG tablet Take 1 tablet (10 mg total) by mouth at bedtime. 10/04/15   Norval Gable, MD  ibuprofen (ADVIL,MOTRIN) 200 MG tablet Take 400 mg by mouth every 6 (six) hours as needed for mild pain.    [provider]  ibuprofen (ADVIL,MOTRIN) 600 MG tablet Take 1 tablet (600 mg total) by mouth every 8 (eight) hours as needed. 11/11/16   Sable Feil, PA-C  methocarbamol (ROBAXIN-750) 750 MG tablet Take 1 tablet (750 mg total) by mouth 4 (four) times daily. 11/11/16   Sable Feil, PA-C    Allergies  Allergen Reactions  . Penicillins Hives  . Ampicillin Rash    No family history on file.  Social History Social History   Tobacco Use  . Smoking status: Never Smoker  . Smokeless tobacco: Never Used  Vaping Use  . Vaping Use: Never used  Substance Use Topics  . Alcohol use: No  . Drug use: Not on file    Review of Systems Constitutional: Negative for fever. Cardiovascular: Negative for chest pain. Respiratory: Negative for shortness of breath. Gastrointestinal: Negative for abdominal pain Musculoskeletal: Negative for musculoskeletal complaints Neurological: Negative for headache All other ROS negative  ____________________________________________   PHYSICAL EXAM:  VITAL SIGNS: ED Triage Vitals  Enc Vitals Group     BP 09/29/19 2035 138/79     Pulse Rate 09/29/19 2035 66     Resp 09/29/19 2035 20     Temp 09/29/19 2035 98.3 F (36.8 C)     Temp Source 09/29/19 2035 Oral     SpO2 09/29/19 2035 100 %     Weight 09/29/19 2036 192 lb (87.1 kg)     Height 09/29/19 2036 5' 4.96" (1.65 m)     Head Circumference --      Peak Flow --      Pain Score 09/29/19 2035 9     Pain Loc --      Pain Edu? --      Excl.  in GC? --    Constitutional: Alert and oriented. Well appearing and in no distress. Eyes: Normal exam ENT      Head: Normocephalic and atraumatic.      Mouth/Throat: Mucous membranes are moist. Cardiovascular: Normal rate, regular rhythm.  Respiratory: Normal respiratory effort without tachypnea nor retractions. Breath sounds are clear Gastrointestinal: Soft and nontender. No distention.  Musculoskeletal: Nontender with normal range of motion in all extremities.  No edema.  No erythema. Neurologic:  Normal speech and language. No gross focal neurologic deficits Skin:  Skin is warm, dry and intact.  Psychiatric: Mood and affect are normal.  ____________________________________________    EKG  EKG viewed and interpreted by  myself shows a normal sinus rhythm at 68 bpm with a narrow QRS, normal axis, normal intervals, no concerning ST changes.  ____________________________________________    RADIOLOGY  Ultrasound is negative  ____________________________________________   INITIAL IMPRESSION / ASSESSMENT AND PLAN / ED COURSE  Pertinent labs & imaging results that were available during my care of the patient were reviewed by me and considered in my medical decision making (see chart for details).   Patient presents to the emergency department for intermittent dizziness fatigue burning sensation throughout her body and pain in her right leg.  Patient's lab work is largely nonrevealing does show mild anemia, mild leukocytosis.  There is no edema to lower extremities, no signs of cellulitis or erythema.  EKG is reassuring.  Physical exam is reassuring.  We will obtain a right lower extremity ultrasound to rule out clot.  If ultrasound is negative we will likely place on gabapentin for possible neuropathic pain.  Patient agreeable to plan.  Ultrasound is negative.  Will discharge with a course of gabapentin and have the patient follow-up with her doctor.  Patient agreeable to plan of care.  Lori Pham was evaluated in Emergency Department on 09/30/2019 for the symptoms described in the history of present illness. She was evaluated in the context of the global COVID-19 pandemic, which necessitated consideration that the patient might be at risk for infection with the SARS-CoV-2 virus that causes COVID-19. Institutional protocols and algorithms that pertain to the evaluation of patients at risk for COVID-19 are in a state of rapid change based on information released by regulatory bodies including the CDC and federal and state organizations. These policies and algorithms were followed during the patient's care in the ED.  ____________________________________________   FINAL CLINICAL IMPRESSION(S) / ED  DIAGNOSES  Right leg pain Dizziness   Minna Antis, MD 09/30/19 [phone]

## 2021-02-20 ENCOUNTER — Other Ambulatory Visit: Payer: Self-pay

## 2021-02-20 ENCOUNTER — Encounter: Payer: Self-pay | Admitting: Internal Medicine

## 2021-02-20 ENCOUNTER — Encounter: Payer: 59 | Admitting: Internal Medicine

## 2021-08-10 IMAGING — US US EXTREM LOW VENOUS*R*
1 series · 14 of 24 positions shown · non-contrast
Comparison: None.

CLINICAL DATA: Right leg pain

EXAM:
RIGHT LOWER EXTREMITY VENOUS DOPPLER ULTRASOUND
TECHNIQUE: Gray-scale sonography with compression, as well as color and duplex
ultrasound, were performed to evaluate the deep venous system(s)
from the level of the common femoral vein through the popliteal and
proximal calf veins.

[Series 1: us venous img lower uni right (dvt) · portal-venous · 14 of 33 slices shown]
[im 1/33]
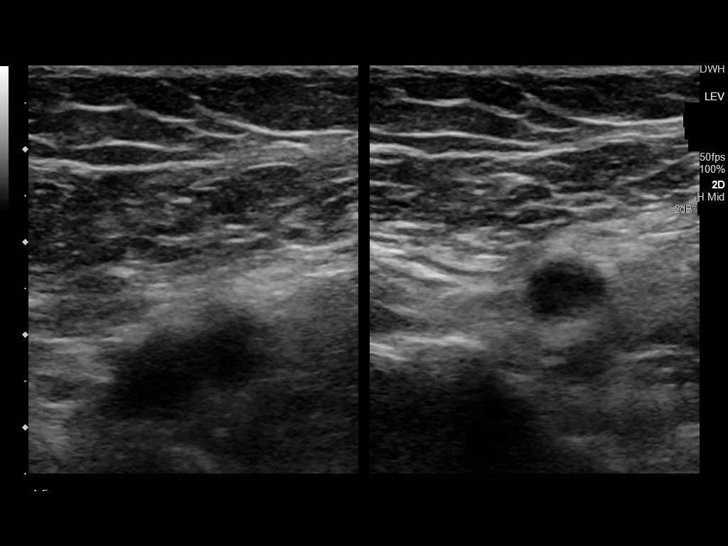
[im 3/33]
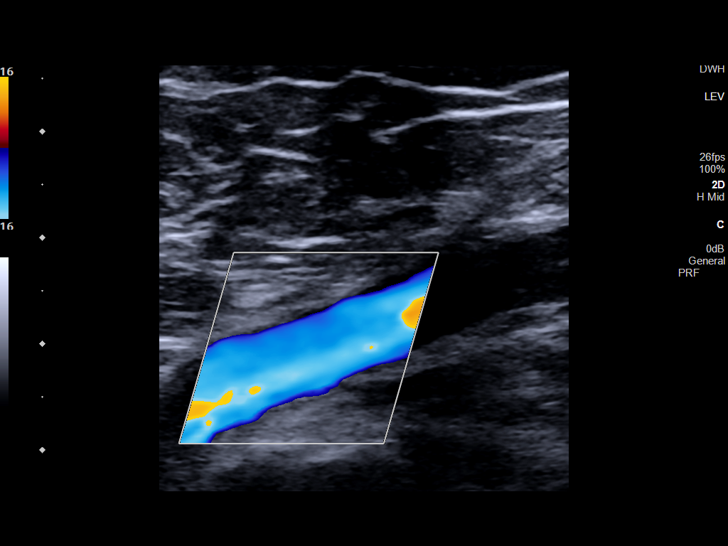
[im 6/33]
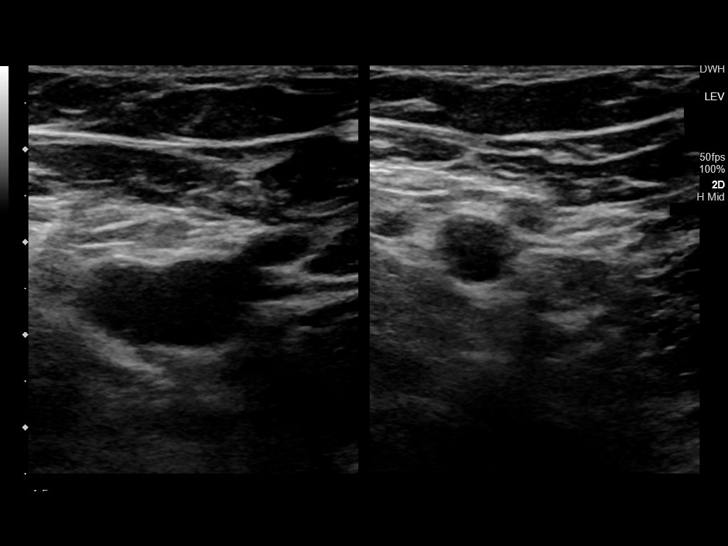
[im 9/33]
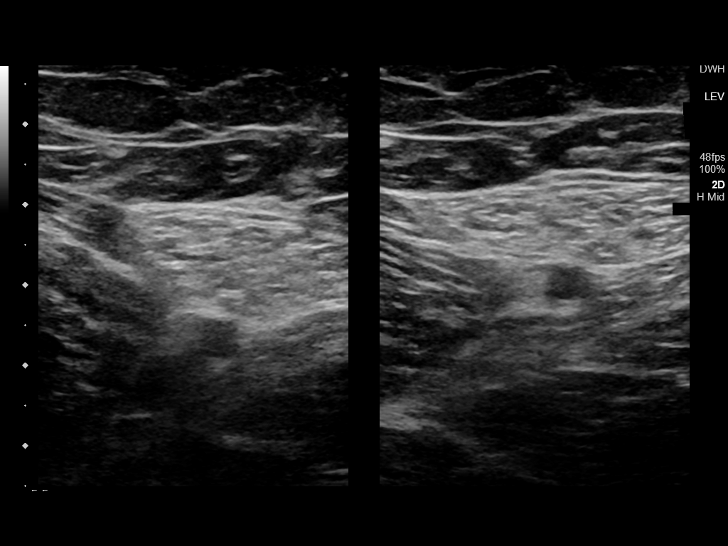
[im 10/33]
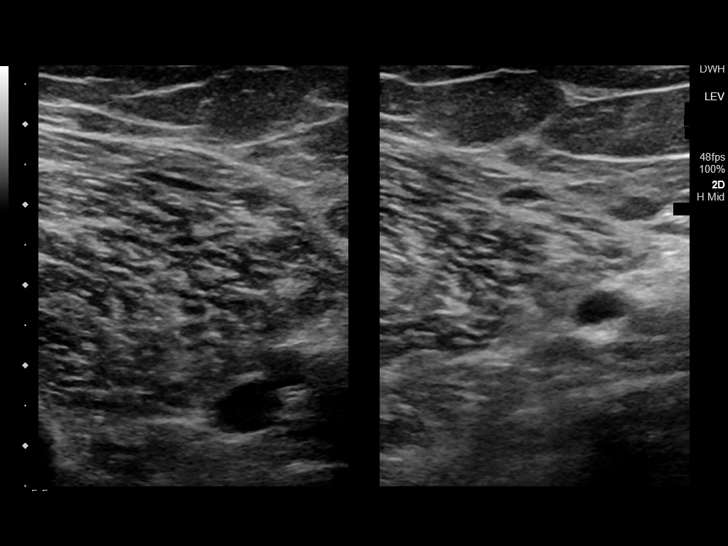
[im 13/33]
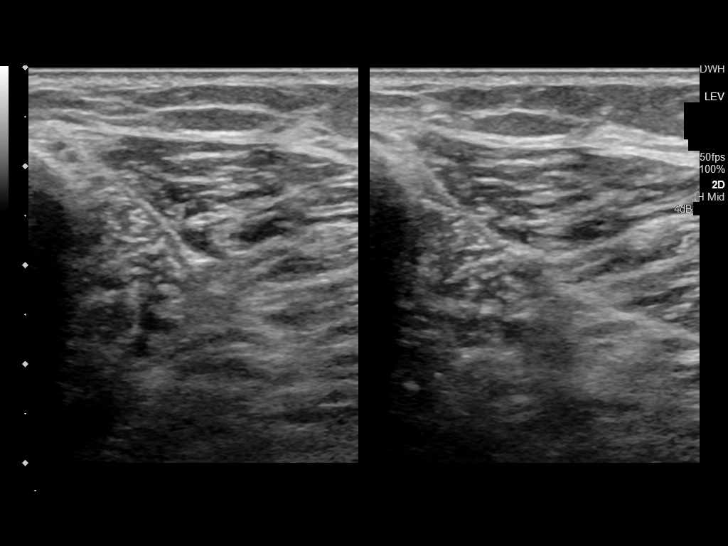
[im 16/33]
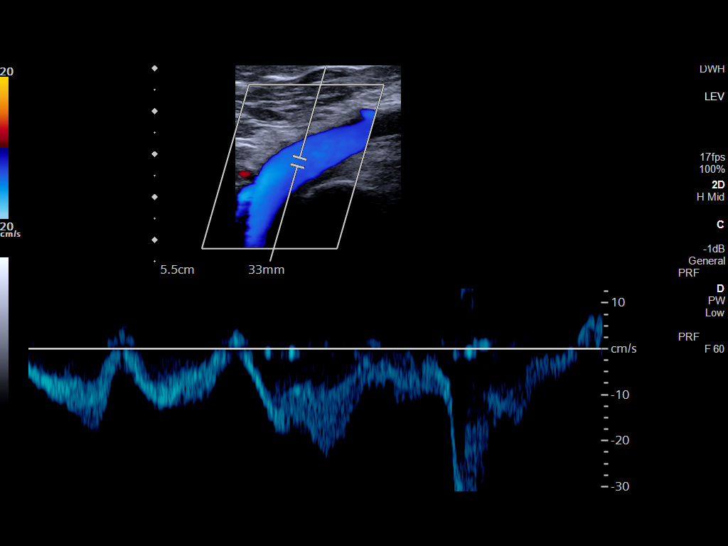
[im 17/33]
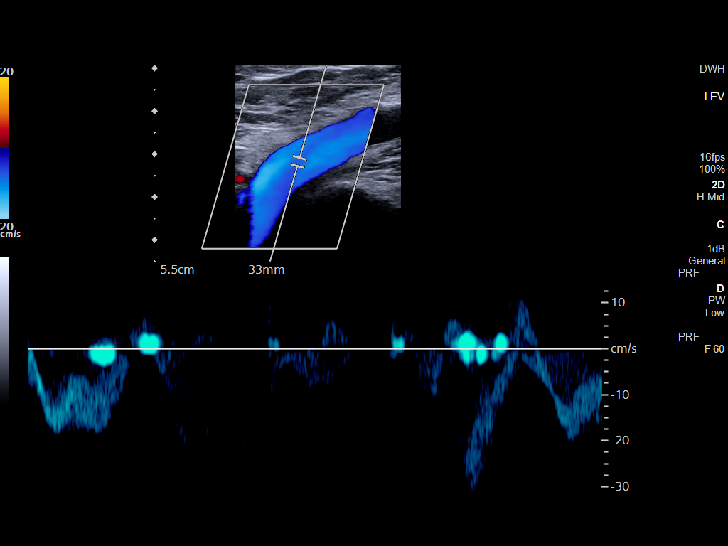
[im 20/33]
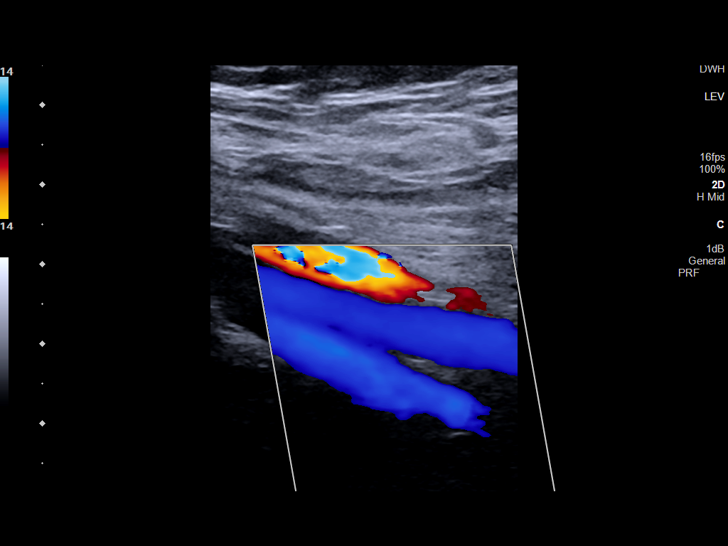
[im 23/33]
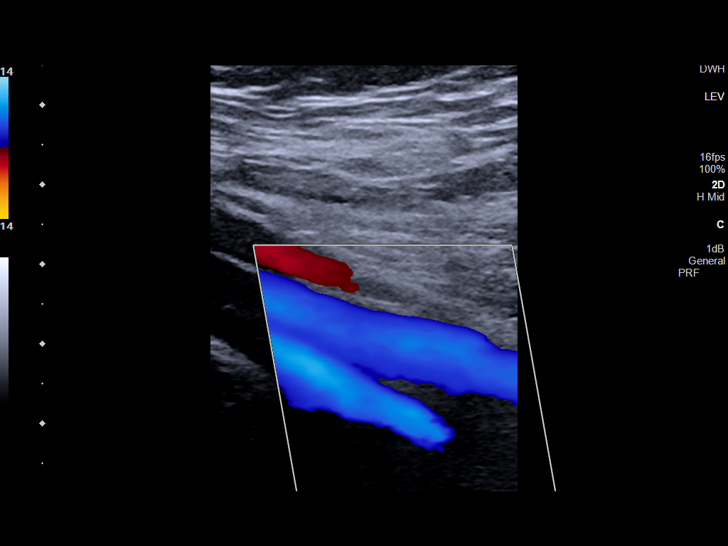
[im 26/33]
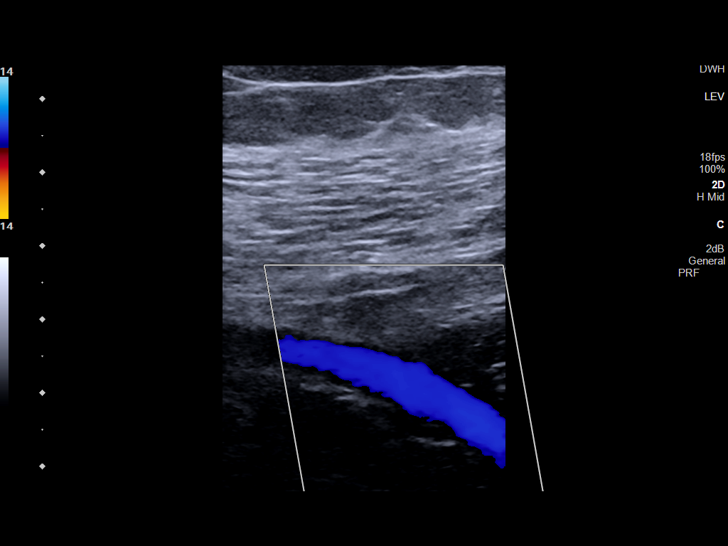
[im 27/33]
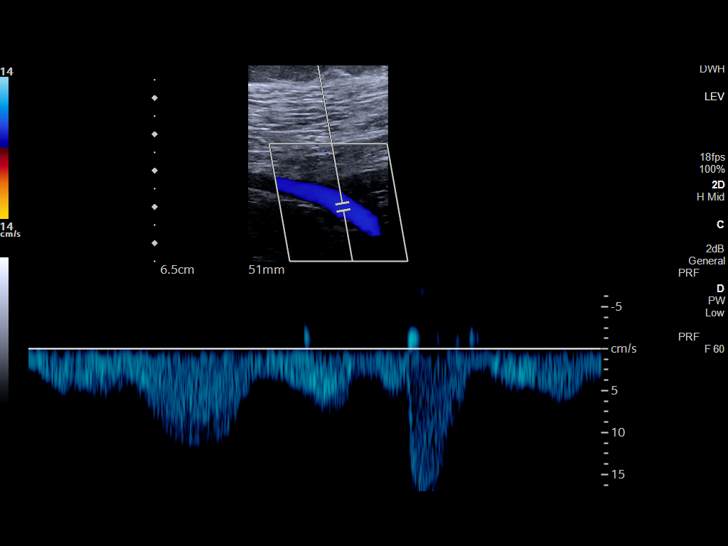
[im 30/33]
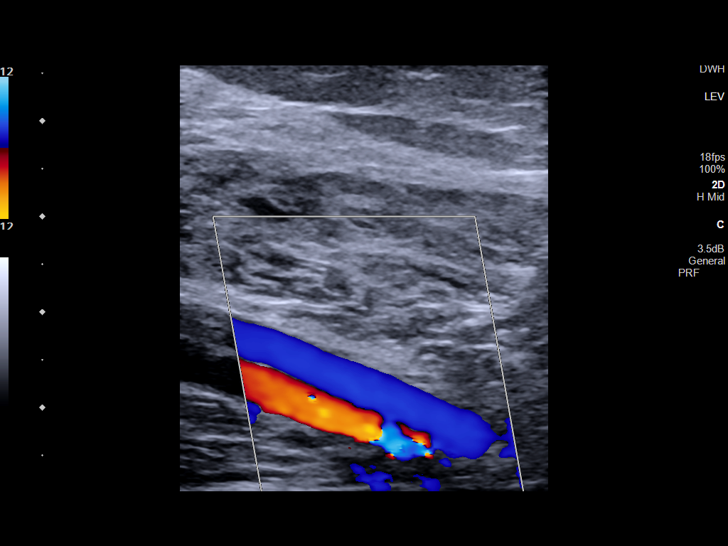
[im 33/33]
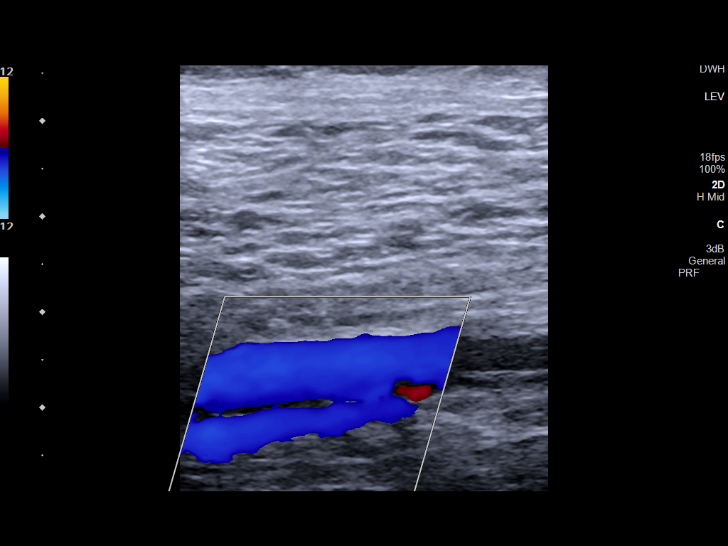

[14 of 24 positions shown; findings below may reference images not displayed]

FINDINGS: VENOUS

Normal compressibility of the common femoral, superficial femoral,
and popliteal veins, as well as the visualized calf veins.
Visualized portions of profunda femoral vein and great saphenous
vein unremarkable. No filling defects to suggest DVT on grayscale or
color Doppler imaging. Doppler waveforms show normal direction of
venous flow, normal respiratory plasticity and response to
augmentation.

Limited views of the contralateral common femoral vein are
unremarkable.

OTHER

None.

Limitations: none
IMPRESSION: Negative.

## 2022-04-16 NOTE — Progress Notes (Signed)
This encounter was created in error - please disregard.

## 2022-12-02 ENCOUNTER — Emergency Department: Payer: BLUE CROSS/BLUE SHIELD

## 2022-12-02 ENCOUNTER — Emergency Department
Admission: EM | Admit: 2022-12-02 | Discharge: 2022-12-02 | Disposition: A | Discharge status: Home / Self Care | Payer: BLUE CROSS/BLUE SHIELD | Attending: Emergency Medicine | Admitting: Emergency Medicine

## 2022-12-02 ENCOUNTER — Other Ambulatory Visit: Payer: Self-pay

## 2022-12-02 DIAGNOSIS — N3000 Acute cystitis without hematuria: Secondary | ICD-10-CM | POA: Insufficient documentation

## 2022-12-02 DIAGNOSIS — R1031 Right lower quadrant pain: Secondary | ICD-10-CM

## 2022-12-02 DIAGNOSIS — I10 Essential (primary) hypertension: Secondary | ICD-10-CM | POA: Insufficient documentation

## 2022-12-02 DIAGNOSIS — D3502 Benign neoplasm of left adrenal gland: Secondary | ICD-10-CM

## 2022-12-02 LAB — URINALYSIS, ROUTINE W REFLEX MICROSCOPIC
Bilirubin Urine: NEGATIVE
Glucose, UA: NEGATIVE mg/dL
Hgb urine dipstick: NEGATIVE
Ketones, ur: NEGATIVE mg/dL
Nitrite: NEGATIVE
Protein, ur: NEGATIVE mg/dL
Specific Gravity, Urine: 1.019 (ref 1.005–1.030)
pH: 5 (ref 5.0–8.0)

## 2022-12-02 LAB — COMPREHENSIVE METABOLIC PANEL
ALT: 77 U/L — ABNORMAL HIGH (ref 0–44)
AST: 51 U/L — ABNORMAL HIGH (ref 15–41)
Albumin: 4.2 g/dL (ref 3.5–5.0)
Alkaline Phosphatase: 60 U/L (ref 38–126)
Anion gap: 8 (ref 5–15)
BUN: 14 mg/dL (ref 6–20)
CO2: 25 mmol/L (ref 22–32)
Calcium: 9 mg/dL (ref 8.9–10.3)
Chloride: 103 mmol/L (ref 98–111)
Creatinine, Ser: 0.43 mg/dL — ABNORMAL LOW (ref 0.44–1.00)
GFR, Estimated: >60 mL/min (ref >60)
Glucose, Bld: 112 mg/dL — ABNORMAL HIGH (ref 70–99)
Potassium: 4 mmol/L (ref 3.5–5.1)
Sodium: 136 mmol/L (ref 135–145)
Total Bilirubin: 0.8 mg/dL (ref 0.3–1.2)
Total Protein: 7.8 g/dL (ref 6.5–8.1)

## 2022-12-02 LAB — POC URINE PREG, ED: Preg Test, Ur: NEGATIVE

## 2022-12-02 LAB — CBC
HCT: 38.3 % (ref 36.0–46.0)
Hemoglobin: 13.2 g/dL (ref 12.0–15.0)
MCH: 29.4 pg (ref 26.0–34.0)
MCHC: 34.5 g/dL (ref 30.0–36.0)
MCV: 85.3 fL (ref 80.0–100.0)
Platelets: 232 10*3/uL (ref 150–400)
RBC: 4.49 MIL/uL (ref 3.87–5.11)
RDW: 12 % (ref 11.5–15.5)
WBC: 6.6 10*3/uL (ref 4.0–10.5)
nRBC: 0 % (ref 0.0–0.2)

## 2022-12-02 LAB — HCG, QUANTITATIVE, PREGNANCY: hCG, Beta Chain, Quant, S: 1 m[IU]/mL (ref <5)

## 2022-12-02 LAB — LIPASE, BLOOD: Lipase: 40 U/L (ref 11–51)

## 2022-12-02 MED ORDER — CEPHALEXIN 500 MG PO CAPS: 500.0000 mg | ORAL_CAPSULE | Freq: Two times a day (BID) | ORAL | 0 refills | Status: AC

## 2022-12-02 MED ORDER — IOHEXOL 300 MG/ML  SOLN
100.0000 mL | Freq: Once | INTRAMUSCULAR | Status: AC | PRN
  Administered 2022-12-02: 100 mL via INTRAVENOUS

## 2022-12-02 MED ORDER — MORPHINE SULFATE (PF) 4 MG/ML IV SOLN
4.0000 mg | Freq: Once | INTRAVENOUS | Status: AC
  Administered 2022-12-02: 4 mg via INTRAVENOUS
  Filled 2022-12-02: qty 1

## 2022-12-02 MED ORDER — SODIUM CHLORIDE 0.9 % IV BOLUS
1000.0000 mL | Freq: Once | INTRAVENOUS | Status: AC
  Administered 2022-12-02: 1000 mL via INTRAVENOUS

## 2022-12-02 MED ORDER — SODIUM CHLORIDE 0.9 % IV SOLN
1.0000 g | Freq: Once | INTRAVENOUS | Status: AC
  Administered 2022-12-02: 1 g via INTRAVENOUS
  Filled 2022-12-02: qty 10

## 2022-12-02 MED ORDER — ONDANSETRON HCL 4 MG/2ML IJ SOLN
4.0000 mg | Freq: Once | INTRAMUSCULAR | Status: AC
  Administered 2022-12-02: 4 mg via INTRAVENOUS
  Filled 2022-12-02: qty 2

## 2022-12-02 MED ORDER — ONDANSETRON 4 MG PO TBDP: 4.0000 mg | ORAL_TABLET | Freq: Three times a day (TID) | ORAL | 0 refills | Status: AC | PRN

## 2022-12-02 MED ORDER — NITROFURANTOIN MONOHYD MACRO 100 MG PO CAPS: 100.0000 mg | ORAL_CAPSULE | Freq: Two times a day (BID) | ORAL | 0 refills | Status: DC

## 2022-12-02 NOTE — ED Triage Notes (Signed)
Pt comes with c/o RLQ abdominal pain. Pt states this started 4 weeks ago. Pt states pain to her ovaries and goes toward her groin area. Pt states some nausea and vomiting.

## 2022-12-02 NOTE — ED Provider Notes (Signed)
Care assumed of patient from outgoing provider.  See their note for initial history, exam and plan.  Clinical Course as of 12/02/22 1717  Mon Dec 02, 2022  1515 RLQ pain, MSK vs appy vs stone. Ct pending.  [SM]    Clinical Course User Index [SM] Corena Herter, MD   CT scan consistent with urinary tract infection.  Consistent with her symptoms of UTI.  No signs of pyelonephritis.  No signs of kidney stone.  Incidental finding of adrenal adenoma which I discussed with the patient.  Recommended outpatient nonemergent MRI for workup.  Given Rocephin and will start on an antibiotic.  Discussed symptomatic control with Motrin and Tylenol and given return precautions to the emergency department.  States that she will follow-up closely with her primary care provider.  Formal translating service utilized   Corena Herter, MD 12/02/22 [phone]

## 2022-12-02 NOTE — ED Notes (Addendum)
Patient IV fluid observed to be infusing slowly. RN manipulated IV and fluids flowing appropriately

## 2022-12-02 NOTE — ED Provider Notes (Signed)
[facility] Provider Note    Event Date/Time   First MD Initiated Contact with Patient 12/02/22 [phone]     (approximate)   History   Abdominal Pain   HPI  Cartina E Jossalynn Stromberg is a 52 y.o. female with history of hypertension presenting to the emergency department for evaluation of abdominal pain.  Patient reports that over the last month or so she has had intermittent right lower quadrant abdominal pain.  Does report that the pain started initially when she was working and she felt a sensation of something bursting in her right lower quadrant area.  Since then, she has had intermittent pain, worse when she is walking around.  No fevers.  Does report nausea without vomiting.  Having bowel movements.  Denies dysuria.      Physical Exam   Triage Vital Signs: ED Triage Vitals  Encounter Vitals Group     BP 12/02/22 0927 (!) 142/81     Systolic BP Percentile --      Diastolic BP Percentile --      Pulse Rate 12/02/22 0927 65     Resp 12/02/22 0927 18     Temp 12/02/22 0927 98 F (36.7 C)     Temp src --      SpO2 12/02/22 0927 99 %     Weight 12/02/22 1002 196 lb 10.4 oz (89.2 kg)     Height 12/02/22 1002 5\' 5"  (1.651 m)     Head Circumference --      Peak Flow --      Pain Score 12/02/22 0928 10     Pain Loc --      Pain Education --      Exclude from Growth Chart --     Most recent vital signs: Vitals:   12/02/22 0927 12/02/22 1425  BP: (!) 142/81 139/79  Pulse: 65 (!) 58  Resp: 18 17  Temp: 98 F (36.7 C) 98 F (36.7 C)  SpO2: 99% 100%     General: Awake, interactive  CV:  Regular rate, good peripheral perfusion.  Resp:  Lungs clear, unlabored respirations.  Abd:  Soft, nondistended, tender to palpation in the right lower quadrant without rebound or guarding.  There is tenderness along the inguinal ligament as well without palpable hernia.  Patient freely ranging hip, but does report pain with this. Neuro:  Symmetric facial  movement, fluid speech   ED Results / Procedures / Treatments   Labs (all labs ordered are listed, but only abnormal results are displayed) Labs Reviewed  COMPREHENSIVE METABOLIC PANEL - Abnormal; Notable for the following components:      Result Value   Glucose, Bld 112 (*)    Creatinine, Ser 0.43 (*)    AST 51 (*)    ALT 77 (*)    All other components within normal limits  URINALYSIS, ROUTINE W REFLEX MICROSCOPIC - Abnormal; Notable for the following components:   Color, Urine YELLOW (*)    APPearance HAZY (*)    Leukocytes,Ua MODERATE (*)    Bacteria, UA RARE (*)    All other components within normal limits  LIPASE, BLOOD  CBC  HCG, QUANTITATIVE, PREGNANCY  POC URINE PREG, ED     EKG EKG independently reviewed interpreted by myself (ER attending) demonstrates:    RADIOLOGY Imaging independently reviewed and interpreted by myself demonstrates:    PROCEDURES:  Critical Care performed: No  Procedures   MEDICATIONS ORDERED IN ED: Medications  sodium chloride 0.9 %  bolus 1,000 mL (0 mLs Intravenous Stopped 12/02/22 1305)  ondansetron (ZOFRAN) injection 4 mg (4 mg Intravenous Given 12/02/22 1035)  morphine (PF) 4 MG/ML injection 4 mg (4 mg Intravenous Given 12/02/22 1035)  iohexol (OMNIPAQUE) 300 MG/ML solution 100 mL (100 mLs Intravenous Contrast Given 12/02/22 1357)     IMPRESSION / MDM / ASSESSMENT AND PLAN / ED COURSE  I reviewed the triage vital signs and the nursing notes.  Differential diagnosis includes, but is not limited to, appendicitis, hernia, diverticulitis, musculoskeletal strain  Patient's presentation is most consistent with acute presentation with potential threat to life or bodily function.  52 year old female presenting to the emergency department for evaluation of right lower quadrant pain.  Vital signs stable on presentation.  Labs sent from triage.  Will treat symptomatically with IV fluids, Zofran, morphine, and obtain CT abdomen pelvis to  further evaluate.  Labs without severe derangement.  Urine questionable for infection with moderate leukocyte esterase, 6-10 white blood cells, rare bacteria.  In obtaining further history from patient, does report she has had some burning with urination.  With this, will send urine culture but go ahead and empirically treat.  Prescription for Macrobid sent to her pharmacy.  Signed out to oncoming provider pending CT, reevaluation, and disposition.  Clinical Course as of 12/02/22 1525  Mon Dec 02, 2022  1515 RLQ pain, MSK vs appy vs stone. Ct pending.  [SM]    Clinical Course User Index [SM] Corena Herter, MD     FINAL CLINICAL IMPRESSION(S) / ED DIAGNOSES   Final diagnoses:  Right lower quadrant abdominal pain  Acute cystitis without hematuria     Rx / DC Orders   ED Discharge Orders     None        Note:  This document was prepared using Dragon voice recognition software and may include unintentional dictation errors.   Trinna Post, MD 12/02/22 [phone]

## 2022-12-02 NOTE — Discharge Instructions (Addendum)
Control de dolor: Ibuprofeno (motrin/aleve/advil): puede tomar de 3 a 4 tabletas (600 a 800 mg) cada 6 horas segn sea necesario para el dolor o la fiebre. Acetaminofn (tylenol): puede tomar 2 tabletas extrafuertes (1000 mg) cada 6 horas segn sea necesario para el dolor o la fiebre. Puede alternar estos medicamentos o tomarlos juntos. Asegrese de comer/beber agua mientras toma estos medicamentos. 

## 2022-12-04 LAB — URINE CULTURE

## 2023-02-27 ENCOUNTER — Other Ambulatory Visit: Payer: Self-pay | Admitting: Family Medicine

## 2023-02-27 DIAGNOSIS — Z1231 Encounter for screening mammogram for malignant neoplasm of breast: Secondary | ICD-10-CM

## 2023-08-25 ENCOUNTER — Other Ambulatory Visit: Payer: Self-pay | Admitting: Family Medicine

## 2023-08-25 DIAGNOSIS — Z1231 Encounter for screening mammogram for malignant neoplasm of breast: Secondary | ICD-10-CM

## 2023-12-05 ENCOUNTER — Other Ambulatory Visit: Payer: Self-pay | Admitting: Family Medicine

## 2023-12-05 DIAGNOSIS — D3500 Benign neoplasm of unspecified adrenal gland: Secondary | ICD-10-CM

## 2023-12-11 ENCOUNTER — Ambulatory Visit
Admission: RE | Admit: 2023-12-11 | Discharge: 2023-12-11 | Disposition: A | Discharge status: Home / Self Care | Payer: Self-pay | Source: Ambulatory Visit | Attending: Family Medicine | Admitting: Family Medicine

## 2023-12-11 DIAGNOSIS — D3500 Benign neoplasm of unspecified adrenal gland: Secondary | ICD-10-CM | POA: Diagnosis present

## 2023-12-11 MED ORDER — GADOBUTROL 1 MMOL/ML IV SOLN
9.0000 mL | Freq: Once | INTRAVENOUS | Status: AC | PRN
  Administered 2023-12-11: 9 mL via INTRAVENOUS
